# Patient Record
Sex: Male | Born: 1975 | Race: White | Hispanic: No | Marital: Married | Smoking: Current every day smoker
Health system: Southern US, Community
[De-identification: ages and names within clinical notes are randomized; demographics above are authoritative.]

---

## 2016-03-01 ENCOUNTER — Emergency Department (HOSPITAL_COMMUNITY): Payer: Medicare Other

## 2016-03-01 ENCOUNTER — Encounter (HOSPITAL_COMMUNITY): Payer: Self-pay | Admitting: *Deleted

## 2016-03-01 DIAGNOSIS — J680 Bronchitis and pneumonitis due to chemicals, gases, fumes and vapors: Secondary | ICD-10-CM | POA: Insufficient documentation

## 2016-03-01 DIAGNOSIS — J9801 Acute bronchospasm: Secondary | ICD-10-CM | POA: Insufficient documentation

## 2016-03-01 DIAGNOSIS — F17200 Nicotine dependence, unspecified, uncomplicated: Secondary | ICD-10-CM | POA: Insufficient documentation

## 2016-03-01 DIAGNOSIS — T65891A Toxic effect of other specified substances, accidental (unintentional), initial encounter: Secondary | ICD-10-CM | POA: Diagnosis not present

## 2016-03-01 DIAGNOSIS — Z880 Allergy status to penicillin: Secondary | ICD-10-CM | POA: Insufficient documentation

## 2016-03-01 DIAGNOSIS — R0602 Shortness of breath: Secondary | ICD-10-CM | POA: Insufficient documentation

## 2016-03-01 LAB — CBC
HCT: 37.1 % — ABNORMAL LOW (ref 39.0–52.0)
Hemoglobin: 12.4 g/dL — ABNORMAL LOW (ref 13.0–17.0)
MCH: 30.4 pg (ref 26.0–34.0)
MCHC: 33.4 g/dL (ref 30.0–36.0)
MCV: 90.9 fL (ref 78.0–100.0)
PLATELETS: 509 10*3/uL — AB (ref 150–400)
RBC: 4.08 MIL/uL — ABNORMAL LOW (ref 4.22–5.81)
RDW: 13.6 % (ref 11.5–15.5)
WBC: 7.6 10*3/uL (ref 4.0–10.5)

## 2016-03-01 LAB — BASIC METABOLIC PANEL
Anion gap: 11 (ref 5–15)
BUN: 11 mg/dL (ref 6–20)
CALCIUM: 10.2 mg/dL (ref 8.9–10.3)
CO2: 24 mmol/L (ref 22–32)
CREATININE: 0.96 mg/dL (ref 0.61–1.24)
Chloride: 103 mmol/L (ref 101–111)
GFR calc non Af Amer: >60 mL/min (ref >60)
GLUCOSE: 178 mg/dL — AB (ref 65–99)
Potassium: 4.2 mmol/L (ref 3.5–5.1)
Sodium: 138 mmol/L (ref 135–145)

## 2016-03-01 LAB — I-STAT TROPONIN, ED: TROPONIN I, POC: 0 ng/mL (ref 0.00–0.08)

## 2016-03-01 MED ORDER — ALBUTEROL SULFATE (2.5 MG/3ML) 0.083% IN NEBU: 5.0000 mg | INHALATION_SOLUTION | Freq: Once | RESPIRATORY_TRACT | Status: DC

## 2016-03-02 ENCOUNTER — Emergency Department (HOSPITAL_COMMUNITY)
Admission: EM | Admit: 2016-03-02 | Discharge: 2016-03-02 | Disposition: A | Discharge status: Home / Self Care | Payer: Medicare Other | Attending: Emergency Medicine | Admitting: Emergency Medicine

## 2016-03-02 DIAGNOSIS — J680 Bronchitis and pneumonitis due to chemicals, gases, fumes and vapors: Secondary | ICD-10-CM

## 2016-03-02 DIAGNOSIS — R0602 Shortness of breath: Secondary | ICD-10-CM | POA: Diagnosis not present

## 2016-03-02 MED ORDER — PREDNISONE 20 MG PO TABS
60.0000 mg | ORAL_TABLET | Freq: Once | ORAL | Status: AC
  Administered 2016-03-02: 20 mg via ORAL
  Filled 2016-03-02: qty 3

## 2016-03-02 MED ORDER — PREDNISONE 20 MG PO TABS: 60.0000 mg | ORAL_TABLET | Freq: Every day | ORAL | Status: AC

## 2016-03-02 MED ORDER — ALBUTEROL SULFATE (2.5 MG/3ML) 0.083% IN NEBU
5.0000 mg | INHALATION_SOLUTION | Freq: Once | RESPIRATORY_TRACT | Status: AC
  Administered 2016-03-02: 5 mg via RESPIRATORY_TRACT
  Filled 2016-03-02: qty 6

## 2016-03-02 MED ORDER — ALBUTEROL SULFATE HFA 108 (90 BASE) MCG/ACT IN AERS
2.0000 | INHALATION_SPRAY | RESPIRATORY_TRACT | Status: DC | PRN
  Administered 2016-03-02: 2 via RESPIRATORY_TRACT
  Filled 2016-03-02: qty 6.7

## 2016-03-02 MED ORDER — TRAMADOL HCL 50 MG PO TABS: 50.0000 mg | ORAL_TABLET | Freq: Four times a day (QID) | ORAL | Status: AC | PRN

## 2016-03-02 NOTE — ED Provider Notes (Signed)
CSN: [id number]     Arrival date & time 03/01/16  2141 History  By signing my name below, I, Freida Busman, attest that this documentation has been prepared under the direction and in the presence of Gilda Crease, MD . Electronically Signed: Freida Busman, Scribe. 03/02/2016. 3:28 AM.    Chief Complaint  Patient presents with  . Shortness of Breath  . Chest Pain      The history is provided by the patient. No language interpreter was used.   HPI Comments:  Charles Petty is a 40 y.o. male who presents to the Emergency Department complaining of sharp, left sided, CP with associated SOB since yesterday. He also notes occasional cough. No alleviating factors noted. Pt is a current everyday smoker.   History reviewed. No pertinent past medical history. History reviewed. No pertinent past surgical history. History reviewed. No pertinent family history. Social History  Substance Use Topics  . Smoking status: Current Every Day Smoker  . Smokeless tobacco: Never Used  . Alcohol Use: No    Review of Systems  Constitutional: Negative for fever.  Respiratory: Positive for cough and shortness of breath.   Cardiovascular: Positive for chest pain.  All other systems reviewed and are negative.   Allergies  Penicillins  Home Medications   Prior to Admission medications   Not on File   BP 120/76 mmHg  Pulse 70  Temp(Src) 98.2 F (36.8 C) (Oral)  Resp 22  SpO2 96% Physical Exam  Constitutional: He is oriented to person, place, and time. He appears well-developed and well-nourished. No distress.  HENT:  Head: Normocephalic and atraumatic.  Right Ear: Hearing normal.  Left Ear: Hearing normal.  Nose: Nose normal.  Mouth/Throat: Oropharynx is clear and moist and mucous membranes are normal.  Eyes: Conjunctivae and EOM are normal. Pupils are equal, round, and reactive to light.  Neck: Normal range of motion. Neck supple.  Cardiovascular: Regular rhythm, S1 normal and S2  normal.  Exam reveals no gallop and no friction rub.   No murmur heard. Pulmonary/Chest: Effort normal and breath sounds normal. No respiratory distress. He exhibits no tenderness.  Abdominal: Soft. Normal appearance and bowel sounds are normal. There is no hepatosplenomegaly. There is no tenderness. There is no rebound, no guarding, no tenderness at McBurney's point and negative Murphy's sign. No hernia.  Musculoskeletal: Normal range of motion.  Neurological: He is alert and oriented to person, place, and time. He has normal strength. No cranial nerve deficit or sensory deficit. Coordination normal. GCS eye subscore is 4. GCS verbal subscore is 5. GCS motor subscore is 6.  Skin: Skin is warm, dry and intact. No rash noted. No cyanosis.  Psychiatric: He has a normal mood and affect. His speech is normal and behavior is normal. Thought content normal.  Nursing note and vitals reviewed.   ED Course  Procedures  DIAGNOSTIC STUDIES:  Oxygen Saturation is 100% on RA, normal by my interpretation.    COORDINATION OF CARE:  2:42 AM Discussed treatment plan with pt at bedside and pt agreed to plan.  Labs Review Labs Reviewed  BASIC METABOLIC PANEL - Abnormal; Notable for the following:    Glucose, Bld 178 (*)    All other components within normal limits  CBC - Abnormal; Notable for the following:    RBC 4.08 (*)    Hemoglobin 12.4 (*)    HCT 37.1 (*)    Platelets 509 (*)    All other components within normal limits  I-STAT  TROPOININ, ED    Imaging Review Dg Chest 2 View  03/01/2016  CLINICAL DATA:  Chest pain and shortness of breath for 2 days. Smoker. EXAM: CHEST  2 VIEW COMPARISON:  None. FINDINGS: Cardiomediastinal silhouette is normal. The lungs are clear without pleural effusions or focal consolidations. Increased lung volumes with mild flattening of the hemidiaphragms. Trachea projects midline and there is no pneumothorax. Soft tissue planes and included osseous structures are  non-suspicious. IMPRESSION: COPD, no superimposed acute cardiopulmonary process. Electronically Signed   By: Awilda Metroourtnay  Bloomer M.D.   On: 03/01/2016 22:20   I have personally reviewed and evaluated these images and lab results as part of my medical decision-making.   EKG Interpretation   Date/Time:  Monday March 01 2016 22:10:07 EDT Ventricular Rate:  93 PR Interval:  134 QRS Duration: 86 QT Interval:  378 QTC Calculation: 469 R Axis:   86 Text Interpretation:  Normal sinus rhythm Normal ECG Confirmed by POLLINA   MD, CHRISTOPHER ([id number]) on 03/02/2016 2:47:28 AM      MDM   Final diagnoses:  None   chemical pneumonitis Bronchospasm  Patient presents to the ER for evaluation of chest pain and shortness of breath. Patient reports a sharp pain in the left upper chest area. Patient reports that symptoms began after he was outside working and was exposed to some kind of chemical or pesticide.  Patient has very atypical chest pain symptoms. He does not have any significant cardiac risk factors, felt to be very low risk for cardiac etiology. Troponin and EKG unremarkable. Chest x-ray was performed, no evidence of acute pneumonia, pneumothorax, etc. He has an everyday smoker and does have evidence of COPD. Patient was administered a nebulizer treatment and had improvement of his symptoms. Patient will be discharged with albuterol and prednisone, follow-up as needed.  I personally performed the services described in this documentation, which was scribed in my presence. The recorded information has been reviewed and is accurate.    Gilda Creasehristopher J Pollina, MD 03/02/16 [phone]

## 2016-03-02 NOTE — Discharge Instructions (Signed)
Chemical Inhalation Injury  A chemical inhalation injury is an internal injury, such as lung damage, that results from breathing in fumes of a chemical or harmful substance (toxic agent). Chemical inhalation injuries most often occur:   During fires, when materials that are burned release chemicals into the environment.   During work accidents, when large quantities of toxic chemicals are spilled at factories or industrial sites.  Chemical inhalation injuries vary in severity. An injury tends to be more severe:   The more acidic or alkaline the chemical is.   The more concentrated the substance is.   The longer you are exposed to the substance.  RISK FACTORS  You are at a high risk for a chemical inhalation injury if you:   Are exposed to burning materials.   Work with chemicals, solvents, or cleaners.  SIGNS AND SYMPTOMS  Symptoms of a chemical inhalation injury may include:   Hoarse voice.   Shortness of breath or trouble breathing.   Chest pain.   Pale or blue skin.   Mucus production.   Cough.   Weakness.   Dizziness or fainting.  DIAGNOSIS  Most chemical inhalation injuries can be diagnosed with a physical exam and medical history. Tests may be done to check for lung damage. They may include:   A blood oxygen level test.   A chest X-ray.   Pulmonary function tests.  There are no tests to identify the specific chemical or substance that caused the injury.  TREATMENT   There is no specific treatment for a chemical inhalation injury. Most treatment is directed at improving the ability of the lungs to deliver oxygen to the body. Time is needed for lung tissue to heal. Supportive treatment may include:   Aerosol treatments to decrease swelling in the airways.   Suctioning of the airways to remove excess mucus.   Supplemental oxygen.  HOME CARE INSTRUCTIONS   Do not use any tobacco products, including cigarettes, chewing tobacco, or electronic cigarettes. If you need help quitting, ask your  health care provider.   Do not allow yourself to be exposed to any airway irritants, such as cigarette smoke or smoke from a fireplace.   Follow your health care provider's instructions for the use of any inhalers.   Take medicines only as directed by your health care provider.   Keep all follow-up visits as directed by your health care provider. This is important.  SEEK MEDICAL CARE IF:   Your symptoms are not improving as your health care provider predicted.  SEEK IMMEDIATE MEDICAL CARE IF:   Your symptoms get worse.   You have increasing shortness of breath or wheezing.   Your skin or your lips appear very pale or blue.   You have a persistent cough.   You cough up blood or dark material.   You have chest pain or weakness.   You have a fever.   You faint.     This information is not intended to replace advice given to you by your health care provider. Make sure you discuss any questions you have with your health care provider.     Document Released: 07/12/2014 Document Reviewed: 07/12/2014  Elsevier Interactive Patient Education 2016 [employer]

## 2016-05-05 ENCOUNTER — Emergency Department (HOSPITAL_COMMUNITY)
Admission: EM | Admit: 2016-05-05 | Discharge: 2016-05-06 | Disposition: A | Discharge status: Home / Self Care | Payer: Medicare Other | Attending: Emergency Medicine | Admitting: Emergency Medicine

## 2016-05-05 ENCOUNTER — Emergency Department (HOSPITAL_COMMUNITY): Payer: Medicare Other

## 2016-05-05 ENCOUNTER — Encounter (HOSPITAL_COMMUNITY): Payer: Self-pay | Admitting: *Deleted

## 2016-05-05 DIAGNOSIS — Z794 Long term (current) use of insulin: Secondary | ICD-10-CM | POA: Diagnosis not present

## 2016-05-05 DIAGNOSIS — F17200 Nicotine dependence, unspecified, uncomplicated: Secondary | ICD-10-CM | POA: Insufficient documentation

## 2016-05-05 DIAGNOSIS — R0789 Other chest pain: Secondary | ICD-10-CM | POA: Diagnosis not present

## 2016-05-05 DIAGNOSIS — E876 Hypokalemia: Secondary | ICD-10-CM

## 2016-05-05 DIAGNOSIS — R079 Chest pain, unspecified: Secondary | ICD-10-CM | POA: Diagnosis present

## 2016-05-05 DIAGNOSIS — R0781 Pleurodynia: Secondary | ICD-10-CM

## 2016-05-05 LAB — CBC
HEMATOCRIT: 33.6 % — AB (ref 39.0–52.0)
Hemoglobin: 11.3 g/dL — ABNORMAL LOW (ref 13.0–17.0)
MCH: 30.2 pg (ref 26.0–34.0)
MCHC: 33.6 g/dL (ref 30.0–36.0)
MCV: 89.8 fL (ref 78.0–100.0)
Platelets: 278 10*3/uL (ref 150–400)
RBC: 3.74 MIL/uL — ABNORMAL LOW (ref 4.22–5.81)
RDW: 14 % (ref 11.5–15.5)
WBC: 4.5 10*3/uL (ref 4.0–10.5)

## 2016-05-05 LAB — BASIC METABOLIC PANEL
Anion gap: 11 (ref 5–15)
BUN: 11 mg/dL (ref 6–20)
CALCIUM: 9.5 mg/dL (ref 8.9–10.3)
CO2: 29 mmol/L (ref 22–32)
Chloride: 91 mmol/L — ABNORMAL LOW (ref 101–111)
Creatinine, Ser: 0.88 mg/dL (ref 0.61–1.24)
GFR calc Af Amer: >60 mL/min (ref >60)
GLUCOSE: 124 mg/dL — AB (ref 65–99)
Potassium: 2.7 mmol/L — CL (ref 3.5–5.1)
Sodium: 131 mmol/L — ABNORMAL LOW (ref 135–145)

## 2016-05-05 LAB — TROPONIN I: Troponin I: <0.03 ng/mL (ref <0.031)

## 2016-05-05 MED ORDER — POTASSIUM CHLORIDE CRYS ER 20 MEQ PO TBCR
40.0000 meq | EXTENDED_RELEASE_TABLET | Freq: Once | ORAL | Status: AC
  Administered 2016-05-05: 40 meq via ORAL
  Filled 2016-05-05: qty 2

## 2016-05-05 MED ORDER — MORPHINE SULFATE (PF) 4 MG/ML IV SOLN
4.0000 mg | Freq: Once | INTRAVENOUS | Status: AC
  Administered 2016-05-05: 4 mg via INTRAVENOUS
  Filled 2016-05-05: qty 1

## 2016-05-05 MED ORDER — SODIUM CHLORIDE 0.9 % IV BOLUS (SEPSIS)
1000.0000 mL | Freq: Once | INTRAVENOUS | Status: AC
Start: 2016-05-05 — End: 2016-05-06
  Administered 2016-05-05: 1000 mL via INTRAVENOUS

## 2016-05-05 MED ORDER — IOPAMIDOL (ISOVUE-370) INJECTION 76%
INTRAVENOUS | Status: AC
  Administered 2016-05-05: 100 mL
  Filled 2016-05-05: qty 100

## 2016-05-05 MED ORDER — POTASSIUM CHLORIDE 10 MEQ/100ML IV SOLN
10.0000 meq | Freq: Once | INTRAVENOUS | Status: AC
  Administered 2016-05-05: 10 meq via INTRAVENOUS
  Filled 2016-05-05: qty 100

## 2016-05-05 NOTE — ED Notes (Signed)
MD Pickering notified of Critical Potassium.

## 2016-05-05 NOTE — ED Provider Notes (Signed)
CSN: [id number]     Arrival date & time 05/05/16  1915 History   First MD Initiated Contact with Patient 05/05/16 2154     Chief Complaint  Patient presents with  . Chest Pain     (Consider location/radiation/quality/duration/timing/severity/associated sxs/prior Treatment) HPI Patient presents with acute onset left-sided chest pain radiating to his back. This started around 7 PM yesterday evening while he was sitting on the couch. He describes the pain as sharp and worse with deep breathing. He's had minimal shortness of breath and cough. No fever or chills. No lower extremity swelling or pain. Patient was seen and treated for chemical pneumonitis several months ago. He states the pain felt similar in nature. No personal or family history of PE. Patient states he has recently had extended travel by car to [place]. Patient states he is stopped smoking cigarettes. He is now vaping. He is concerned that his symptoms may be related to inhalation of vaping chemical. History reviewed. No pertinent past medical history. History reviewed. No pertinent past surgical history. No family history on file. Social History  Substance Use Topics  . Smoking status: Current Every Day Smoker  . Smokeless tobacco: Never Used  . Alcohol Use: No    Review of Systems  Constitutional: Negative for fever and chills.  Respiratory: Positive for cough and shortness of breath. Negative for wheezing.   Cardiovascular: Positive for chest pain. Negative for palpitations and leg swelling.  Gastrointestinal: Negative for nausea, vomiting, abdominal pain, diarrhea and constipation.  Musculoskeletal: Positive for myalgias and back pain. Negative for neck pain and neck stiffness.  Skin: Negative for rash and wound.  Neurological: Negative for dizziness, weakness, light-headedness, numbness and headaches.  Psychiatric/Behavioral: The patient is nervous/anxious.   All other systems reviewed and are  negative.     Allergies  Penicillins  Home Medications   Prior to Admission medications   Medication Sig Start Date End Date Taking? Authorizing Provider  balsalazide (COLAZAL) 750 MG capsule Take 2,250 mg by mouth 3 (three) times daily. 04/05/16  Yes Historical Provider, MD  [employer] CALCIUM 1500 (600 Ca) MG TABS tablet Take 1 tablet by mouth 2 (two) times daily. 03/29/16  Yes Historical Provider, MD  [employer] D3 400 units CAPS Take 1 capsule by mouth daily. 04/07/16  Yes Historical Provider, MD  esomeprazole (NEXIUM) 40 MG capsule Take 40 mg by mouth daily. 03/23/16  Yes Historical Provider, MD  ferrous sulfate 325 (65 FE) MG tablet Take 325 mg by mouth daily. 03/23/16  Yes Historical Provider, MD  gabapentin (NEURONTIN) 800 MG tablet Take 1 tablet by mouth 3 (three) times daily. 04/06/16  Yes Historical Provider, MD  insulin glargine (LANTUS) 100 UNIT/ML injection Inject 41 Units into the skin at bedtime.   Yes Historical Provider, MD  mercaptopurine (PURINETHOL) 50 MG tablet Take 100 mg by mouth daily. 04/05/16  Yes Historical Provider, MD  ondansetron (ZOFRAN) 8 MG tablet Take 1 tablet by mouth 3 (three) times daily. 04/07/16  Yes Historical Provider, MD  oxymorphone (OPANA ER) 15 MG 12 hr tablet Take 1 tablet by mouth 2 (two) times daily. 04/06/16  Yes Historical Provider, MD  ibuprofen (ADVIL,MOTRIN) 600 MG tablet Take 1 tablet (600 mg total) by mouth 3 (three) times daily after meals. 05/06/16   Loren Racer, MD  methocarbamol (ROBAXIN) 500 MG tablet Take 2 tablets (1,000 mg total) by mouth every 8 (eight) hours as needed for muscle spasms. 05/06/16   Loren Racer, MD  predniSONE (DELTASONE) 20 MG tablet  Take 3 tablets (60 mg total) by mouth daily with breakfast. 03/02/16   Gilda Crease, MD  traMADol (ULTRAM) 50 MG tablet Take 1 tablet (50 mg total) by mouth every 6 (six) hours as needed. 03/02/16   Gilda Crease, MD   BP 117/57 mmHg  Pulse 79  Temp(Src) 98.8 F (37.1 C) (Oral)   Resp 13  Wt 186 lb 9.6 oz (84.641 kg)  SpO2 97% Physical Exam  Constitutional: He is oriented to person, place, and time. He appears well-developed and well-nourished. No distress.  Anxious  HENT:  Head: Normocephalic and atraumatic.  Mouth/Throat: Oropharynx is clear and moist.  Eyes: EOM are normal. Pupils are equal, round, and reactive to light.  Neck: Normal range of motion. Neck supple. No JVD present.  Cardiovascular: Normal rate and regular rhythm.  Exam reveals no gallop and no friction rub.   No murmur heard. Pulmonary/Chest: Effort normal and breath sounds normal. No respiratory distress. He has no wheezes. He has no rales. He exhibits no tenderness.  Abdominal: Soft. Bowel sounds are normal. He exhibits no distension and no mass. There is no tenderness. There is no rebound and no guarding.  Musculoskeletal: Normal range of motion. He exhibits tenderness. He exhibits no edema.  Patient has thoracic tenderness to palpation especially over the medial border of the right and left scapula. No obvious deformity. Distal pulses are equal and intact. No lower extremity swelling or asymmetry.  Neurological: He is alert and oriented to person, place, and time.  5/5 motor in all extremities. Sensation is fully intact.  Skin: Skin is warm and dry. No rash noted. No erythema.  Psychiatric: His behavior is normal.  Nursing note and vitals reviewed.   ED Course  Procedures (including critical care time) Labs Review Labs Reviewed  BASIC METABOLIC PANEL - Abnormal; Notable for the following:    Sodium 131 (*)    Potassium 2.7 (*)    Chloride 91 (*)    Glucose, Bld 124 (*)    All other components within normal limits  CBC - Abnormal; Notable for the following:    RBC 3.74 (*)    Hemoglobin 11.3 (*)    HCT 33.6 (*)    All other components within normal limits  TROPONIN I    Imaging Review Dg Chest 2 View  05/05/2016  CLINICAL DATA:  Chest pain x 1 day, smoker EXAM: CHEST - 2 VIEW  COMPARISON:  03/01/2016 FINDINGS: Lungs clear, mildly hyperinflated. Heart size and mediastinal contours are within normal limits. No effusion. Visualized bones unremarkable. IMPRESSION: Hyperinflation without acute or superimposed abnormality. Electronically Signed   By: Corlis Leak M.D.   On: 05/05/2016 20:00   Ct Angio Chest Pe W/cm &/or Wo Cm  05/06/2016  CLINICAL DATA:  Acute onset of shortness of breath and generalized chest pain. Initial encounter. EXAM: CT ANGIOGRAPHY CHEST WITH CONTRAST TECHNIQUE: Multidetector CT imaging of the chest was performed using the standard protocol during bolus administration of intravenous contrast. Multiplanar CT image reconstructions and MIPs were obtained to evaluate the vascular anatomy. CONTRAST:  100 mL of Isovue 370 IV contrast COMPARISON:  Chest radiograph performed earlier today at 7:41 p.m. FINDINGS: There is no evidence of pulmonary embolus. Minimal bibasilar atelectasis is noted. The lungs are otherwise clear. There is no evidence of significant focal consolidation, pleural effusion or pneumothorax. No masses are identified; no abnormal focal contrast enhancement is seen. Diffuse coronary artery calcifications are seen. The mediastinum is otherwise unremarkable. No mediastinal lymphadenopathy  is seen. No pericardial effusion is identified. The great vessels are grossly unremarkable. No axillary lymphadenopathy is seen. The visualized portions of the thyroid gland are unremarkable in appearance. The visualized portions of the liver and spleen are unremarkable. The visualized portions of the pancreas, gallbladder, stomach, adrenal glands and right kidney are within normal limits. A small left renal cyst is noted. No acute osseous abnormalities are seen. Review of the MIP images confirms the above findings. IMPRESSION: 1. No evidence of pulmonary embolus. 2. Minimal bibasilar atelectasis noted.  Lungs otherwise clear. 3. Diffuse coronary artery calcifications seen. 4.  Small left renal cyst noted. Electronically Signed   By: Roanna RaiderJeffery  Chang M.D.   On: 05/06/2016 00:11   I have personally reviewed and evaluated these images and lab results as part of my medical decision-making.   EKG Interpretation   Date/Time:  Wednesday May 05 2016 19:18:52 EDT Ventricular Rate:  88 PR Interval:  130 QRS Duration: 94 QT Interval:  362 QTC Calculation: 438 R Axis:   73 Text Interpretation:  Normal sinus rhythm Normal ECG Confirmed by  Ranae PalmsYELVERTON  MD, Makyi Ledo ([id number]) on 05/05/2016 9:55:42 PM      MDM   Final diagnoses:  Pleuritic chest pain  Hypokalemia    Patient presents with atypical chest pain. EKG is unchanged from previous. Troponin is normal. Given the chest pain started at >24 hrs. ago, 1 troponin is adequate to rule out acute MI. Chest pain is pleuritic in nature. We'll get CT angio chest to rule out PE. We'll also initiate potassium replacement.   CT chest without any acute findings. Will discharge home with anti-inflammatory and muscle relaxants. Return precautions given.  Loren Raceravid Carron Mcmurry, MD 05/06/16 [phone]

## 2016-05-05 NOTE — ED Notes (Signed)
The pt is c/o lt sided chest pain all day worse when he takes a deep breath.  A month ago he was here for inhalation of chemicals  No new exposure.  He is a smoker

## 2016-05-06 DIAGNOSIS — R0789 Other chest pain: Secondary | ICD-10-CM | POA: Diagnosis not present

## 2016-05-06 LAB — CBG MONITORING, ED: GLUCOSE-CAPILLARY: 167 mg/dL — AB (ref 65–99)

## 2016-05-06 MED ORDER — IBUPROFEN 600 MG PO TABS: 600.0000 mg | ORAL_TABLET | Freq: Three times a day (TID) | ORAL | Status: AC

## 2016-05-06 MED ORDER — METHOCARBAMOL 500 MG PO TABS: 1000.0000 mg | ORAL_TABLET | Freq: Three times a day (TID) | ORAL | Status: AC | PRN

## 2016-05-06 NOTE — Discharge Instructions (Signed)
Nonspecific Chest Pain  °Chest pain can be caused by many different conditions. There is always a chance that your pain could be related to something serious, such as a heart attack or a blood clot in your lungs. Chest pain can also be caused by conditions that are not life-threatening. If you have chest pain, it is very important to follow up with your health care provider. °CAUSES  °Chest pain can be caused by: °· Heartburn. °· Pneumonia or bronchitis. °· Anxiety or stress. °· Inflammation around your heart (pericarditis) or lung (pleuritis or pleurisy). °· A blood clot in your lung. °· A collapsed lung (pneumothorax). It can develop suddenly on its own (spontaneous pneumothorax) or from trauma to the chest. °· Shingles infection (varicella-zoster virus). °· Heart attack. °· Damage to the bones, muscles, and cartilage that make up your chest wall. This can include: °· Bruised bones due to injury. °· Strained muscles or cartilage due to frequent or repeated coughing or overwork. °· Fracture to one or more ribs. °· Sore cartilage due to inflammation (costochondritis). °RISK FACTORS  °Risk factors for chest pain may include: °· Activities that increase your risk for trauma or injury to your chest. °· Respiratory infections or conditions that cause frequent coughing. °· Medical conditions or overeating that can cause heartburn. °· Heart disease or family history of heart disease. °· Conditions or health behaviors that increase your risk of developing a blood clot. °· Having had chicken pox (varicella zoster). °SIGNS AND SYMPTOMS °Chest pain can feel like: °· Burning or tingling on the surface of your chest or deep in your chest. °· Crushing, pressure, aching, or squeezing pain. °· Dull or sharp pain that is worse when you move, cough, or take a deep breath. °· Pain that is also felt in your back, neck, shoulder, or arm, or pain that spreads to any of these areas. °Your chest pain may come and go, or it may stay  constant. °DIAGNOSIS °Lab tests or other studies may be needed to find the cause of your pain. Your health care provider may have you take a test called an ambulatory ECG (electrocardiogram). An ECG records your heartbeat patterns at the time the test is performed. You may also have other tests, such as: °· Transthoracic echocardiogram (TTE). During echocardiography, sound waves are used to create a picture of all of the heart structures and to look at how blood flows through your heart. °· Transesophageal echocardiogram (TEE). This is a more advanced imaging test that obtains images from inside your body. It allows your health care provider to see your heart in finer detail. °· Cardiac monitoring. This allows your health care provider to monitor your heart rate and rhythm in real time. °· Holter monitor. This is a portable device that records your heartbeat and can help to diagnose abnormal heartbeats. It allows your health care provider to track your heart activity for several days, if needed. °· Stress tests. These can be done through exercise or by taking medicine that makes your heart beat more quickly. °· Blood tests. °· Imaging tests. °TREATMENT  °Your treatment depends on what is causing your chest pain. Treatment may include: °· Medicines. These may include: °· Acid blockers for heartburn. °· Anti-inflammatory medicine. °· Pain medicine for inflammatory conditions. °· Antibiotic medicine, if an infection is present. °· Medicines to dissolve blood clots. °· Medicines to treat coronary artery disease. °· Supportive care for conditions that do not require medicines. This may include: °· Resting. °· Applying heat   or cold packs to injured areas. °· Limiting activities until pain decreases. °HOME CARE INSTRUCTIONS °· If you were prescribed an antibiotic medicine, finish it all even if you start to feel better. °· Avoid any activities that bring on chest pain. °· Do not use any tobacco products, including  cigarettes, chewing tobacco, or electronic cigarettes. If you need help quitting, ask your health care provider. °· Do not drink alcohol. °· Take medicines only as directed by your health care provider. °· Keep all follow-up visits as directed by your health care provider. This is important. This includes any further testing if your chest pain does not go away. °· If heartburn is the cause for your chest pain, you may be told to keep your head raised (elevated) while sleeping. This reduces the chance that acid will go from your stomach into your esophagus. °· Make lifestyle changes as directed by your health care provider. These may include: °· Getting regular exercise. Ask your health care provider to suggest some activities that are safe for you. °· Eating a heart-healthy diet. A registered dietitian can help you to learn healthy eating options. °· Maintaining a healthy weight. °· Managing diabetes, if necessary. °· Reducing stress. °SEEK MEDICAL CARE IF: °· Your chest pain does not go away after treatment. °· You have a rash with blisters on your chest. °· You have a fever. °SEEK IMMEDIATE MEDICAL CARE IF:  °· Your chest pain is worse. °· You have an increasing cough, or you cough up blood. °· You have severe abdominal pain. °· You have severe weakness. °· You faint. °· You have chills. °· You have sudden, unexplained chest discomfort. °· You have sudden, unexplained discomfort in your arms, back, neck, or jaw. °· You have shortness of breath at any time. °· You suddenly start to sweat, or your skin gets clammy. °· You feel nauseous or you vomit. °· You suddenly feel light-headed or dizzy. °· Your heart begins to beat quickly, or it feels like it is skipping beats. °These symptoms may represent a serious problem that is an emergency. Do not wait to see if the symptoms will go away. Get medical help right away. Call your local emergency services (911 in the [place] Do not drive yourself to the hospital. °  °This  information is not intended to replace advice given to you by your health care provider. Make sure you discuss any questions you have with your health care provider. °  °Document Released: 08/18/2005 Document Revised: 11/29/2014 Document Reviewed: 06/14/2014 °[employer] Interactive Patient Education ©2016 [employer] ° °Hypokalemia °Hypokalemia means that the amount of potassium in the blood is lower than normal. Potassium is a chemical, called an electrolyte, that helps regulate the amount of fluid in the body. It also stimulates muscle contraction and helps nerves function properly. Most of the body's potassium is inside of cells, and only a very small amount is in the blood. Because the amount in the blood is so small, minor changes can be life-threatening. °CAUSES °· Antibiotics. °· Diarrhea or vomiting. °· Using laxatives too much, which can cause diarrhea. °· Chronic kidney disease. °· Water pills (diuretics). °· Eating disorders (bulimia). °· Low magnesium level. °· Sweating a lot. °SIGNS AND SYMPTOMS °· Weakness. °· Constipation. °· Fatigue. °· Muscle cramps. °· Mental confusion. °· Skipped heartbeats or irregular heartbeat (palpitations). °· Tingling or numbness. °DIAGNOSIS  °Your health care provider can diagnose hypokalemia with blood tests. In addition to checking your potassium level, your health care provider may   also check other lab tests. °TREATMENT °Hypokalemia can be treated with potassium supplements taken by mouth or adjustments in your current medicines. If your potassium level is very low, you may need to get potassium through a vein (IV) and be monitored in the hospital. A diet high in potassium is also helpful. Foods high in potassium are: °· Nuts, such as peanuts and pistachios. °· Seeds, such as sunflower seeds and pumpkin seeds. °· Peas, lentils, and lima beans. °· Whole grain and bran cereals and breads. °· Fresh fruit and vegetables, such as apricots, avocado, bananas, cantaloupe, kiwi,  oranges, tomatoes, asparagus, and potatoes. °· Orange and tomato juices. °· Red meats. °· Fruit yogurt. °HOME CARE INSTRUCTIONS °· Take all medicines as prescribed by your health care provider. °· Maintain a healthy diet by including nutritious food, such as fruits, vegetables, nuts, whole grains, and lean meats. °· If you are taking a laxative, be sure to follow the directions on the label. °SEEK MEDICAL CARE IF: °· Your weakness gets worse. °· You feel your heart pounding or racing. °· You are vomiting or having diarrhea. °· You are diabetic and having trouble keeping your blood glucose in the normal range. °SEEK IMMEDIATE MEDICAL CARE IF: °· You have chest pain, shortness of breath, or dizziness. °· You are vomiting or having diarrhea for more than 2 days. °· You faint. °MAKE SURE YOU:  °· Understand these instructions. °· Will watch your condition. °· Will get help right away if you are not doing well or get worse. °  °This information is not intended to replace advice given to you by your health care provider. Make sure you discuss any questions you have with your health care provider. °  °Document Released: 11/08/2005 Document Revised: 11/29/2014 Document Reviewed: 05/11/2013 °[employer] Interactive Patient Education ©2016 [employer] ° °

## 2017-03-22 IMAGING — DX DG CHEST 2V
2 series · 2 of 2 positions shown · non-contrast
Comparison: None.

CLINICAL DATA: Chest pain and shortness of breath for 2 days.
Smoker.

EXAM:
CHEST  2 VIEW

[chest pa]
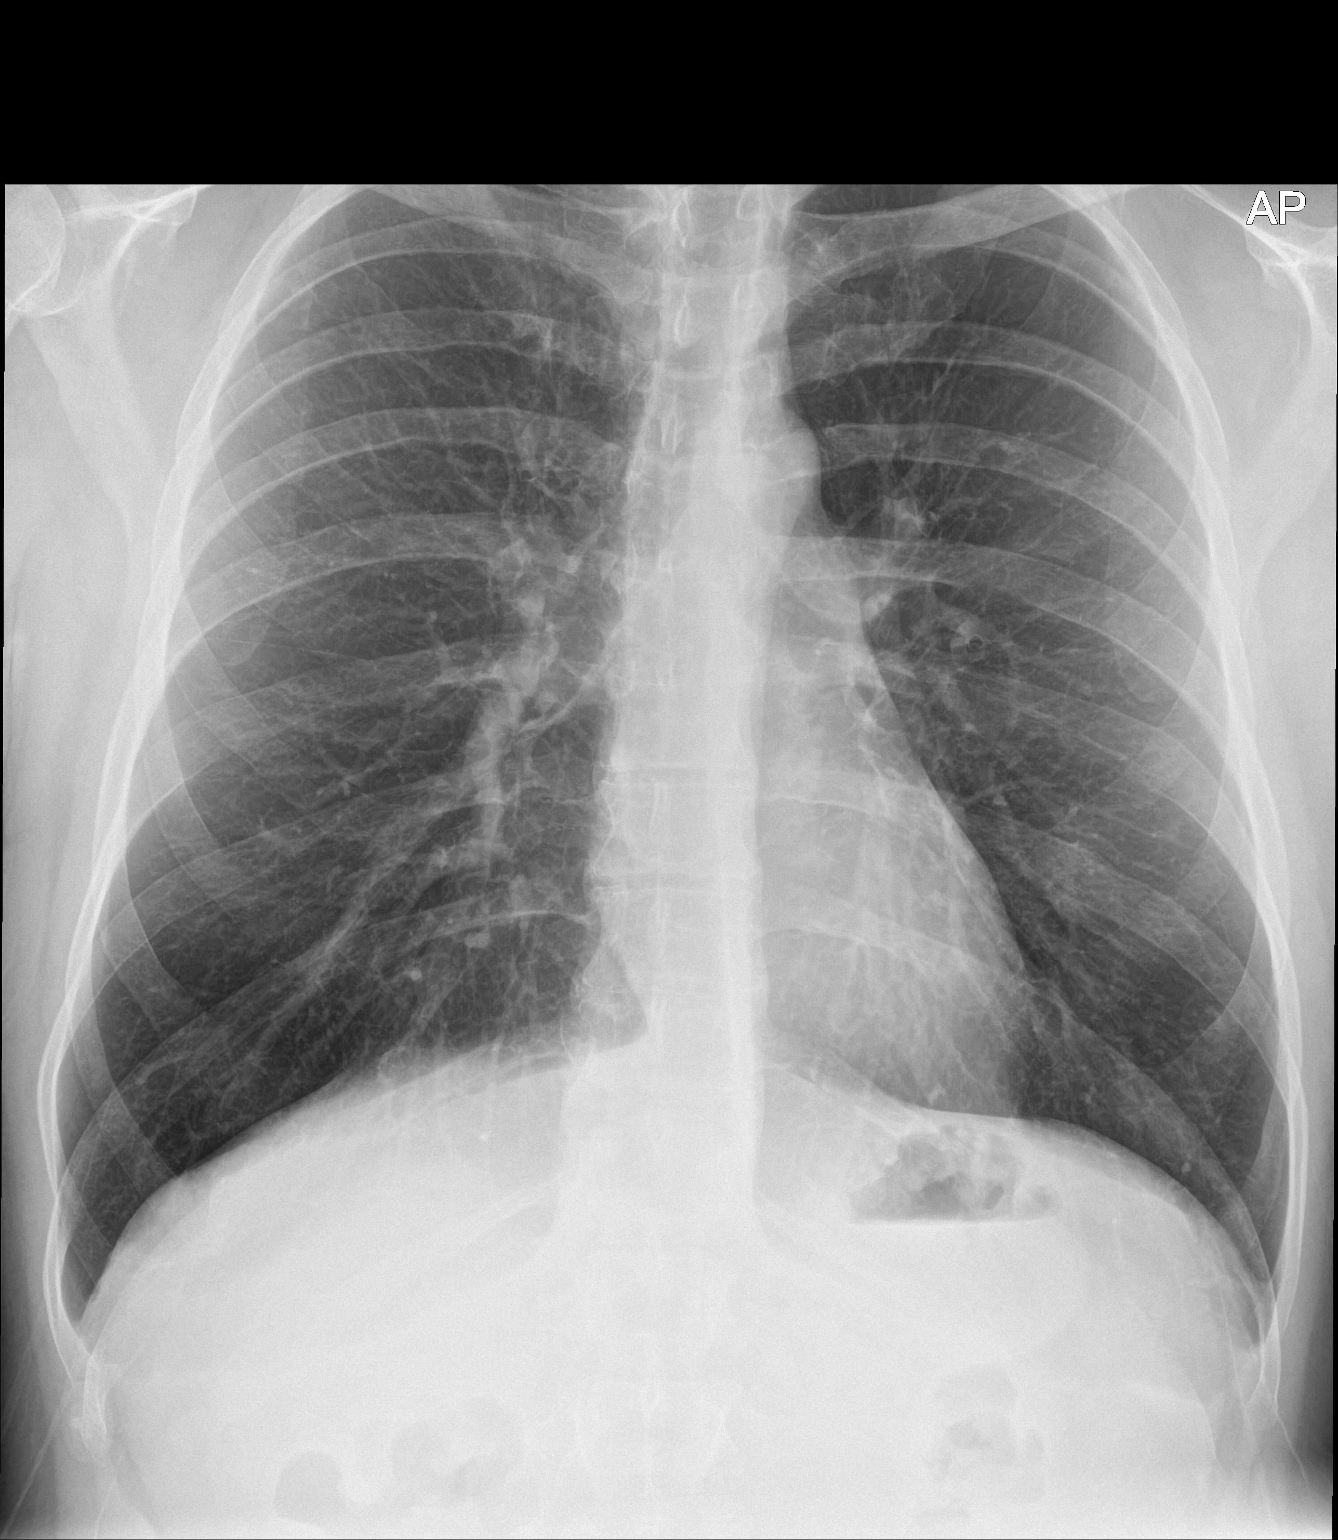

[chest lat]
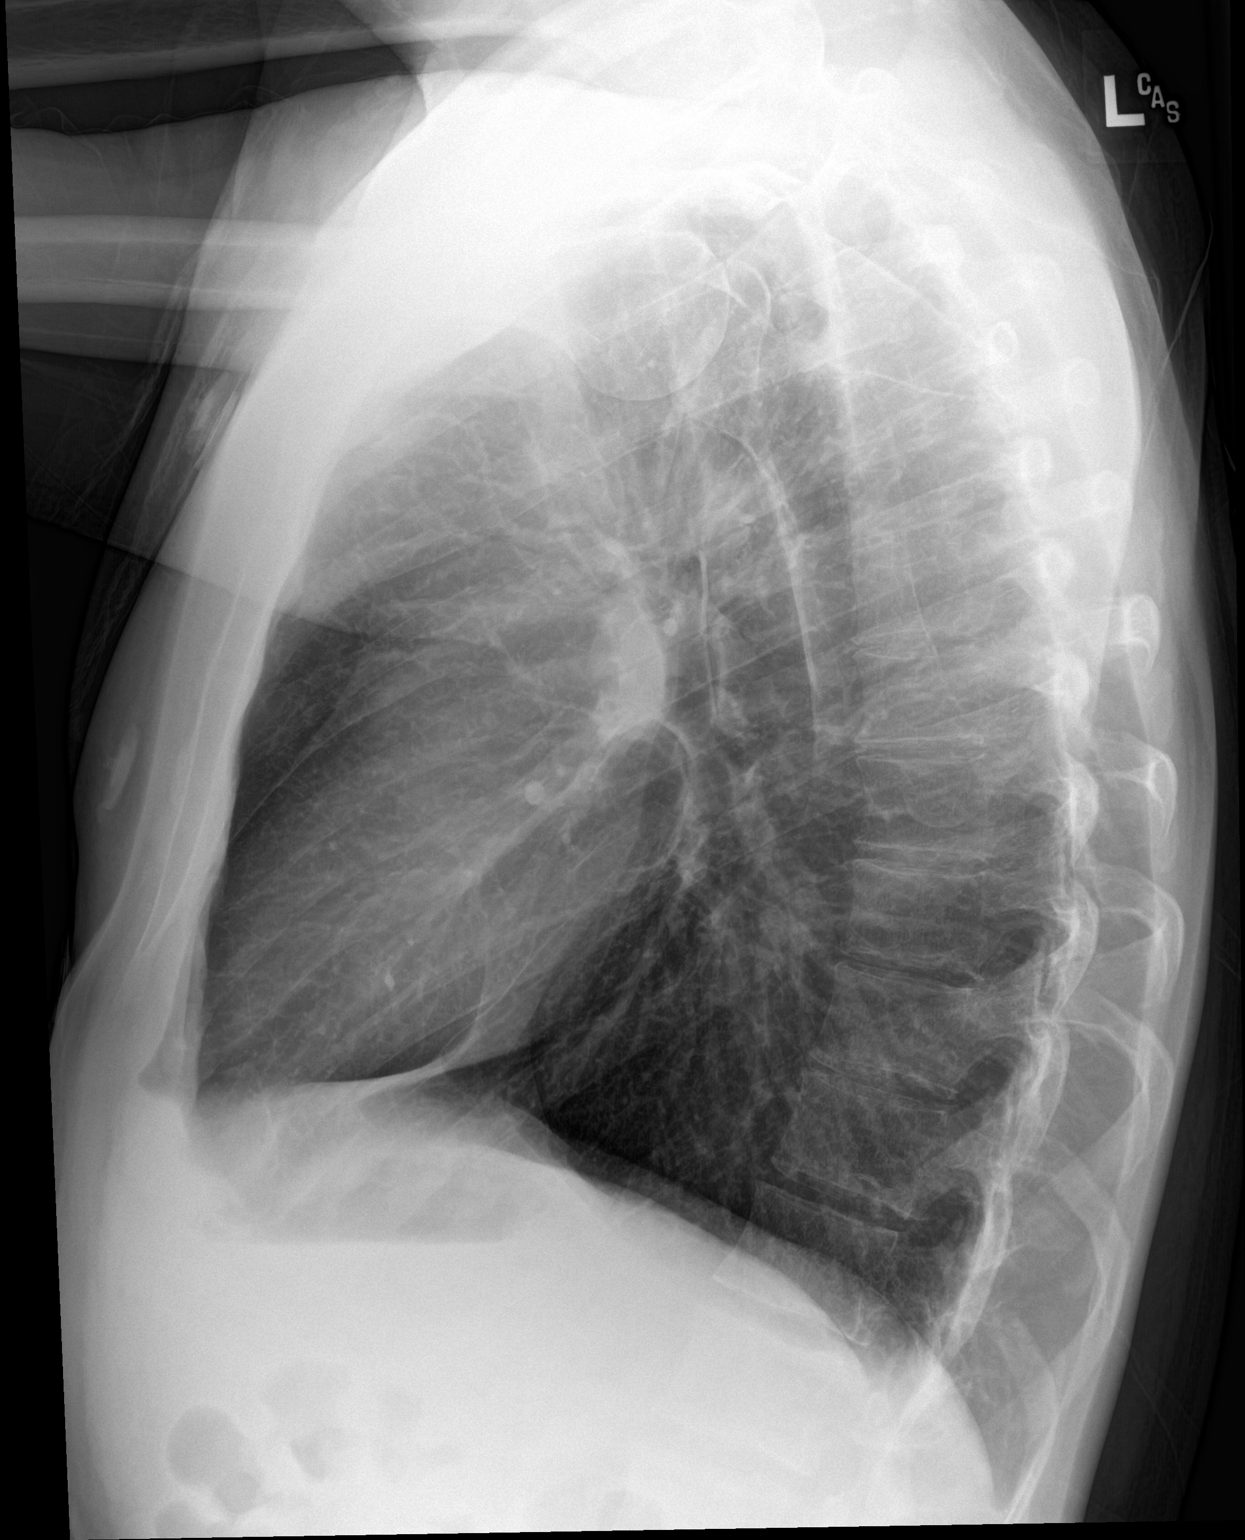

[2 of 2 positions shown; findings below may reference images not displayed]

FINDINGS: Cardiomediastinal silhouette is normal. The lungs are clear without
pleural effusions or focal consolidations. Increased lung volumes
with mild flattening of the hemidiaphragms. Trachea projects midline
and there is no pneumothorax. Soft tissue planes and included
osseous structures are non-suspicious.
IMPRESSION: COPD, no superimposed acute cardiopulmonary process.

## 2018-02-08 ENCOUNTER — Emergency Department (HOSPITAL_COMMUNITY)
Admission: EM | Admit: 2018-02-08 | Discharge: 2018-02-08 | Discharge status: Against Medical Advice | Payer: Medicare Other | Attending: Emergency Medicine | Admitting: Emergency Medicine

## 2018-02-08 DIAGNOSIS — Z5321 Procedure and treatment not carried out due to patient leaving prior to being seen by health care provider: Secondary | ICD-10-CM | POA: Insufficient documentation

## 2018-02-08 NOTE — ED Notes (Signed)
Pt vitals obtained in triage. Pt was waiting to see RN and immediately got up in anger of having to wait. Pt was encouraged to stay and see a provider. Pt threw stickers at tech and said he was 'going to another hospital.' Pt seen leaving the hospital with family.
